# Patient Record
Sex: Male | Born: 1975 | Race: Black or African American | Hispanic: No | Marital: Married
Health system: Southern US, Community
[De-identification: ages and names within clinical notes are randomized; demographics above are authoritative.]

---

## 2013-08-21 ENCOUNTER — Inpatient Hospital Stay: Payer: Self-pay | Admitting: Surgery

## 2013-08-21 ENCOUNTER — Ambulatory Visit: Payer: Self-pay | Admitting: Family Medicine

## 2013-08-21 LAB — COMPREHENSIVE METABOLIC PANEL
Albumin: 3.7 g/dL (ref 3.4–5.0)
Calcium, Total: 9.2 mg/dL (ref 8.5–10.1)
Chloride: 97 mmol/L — ABNORMAL LOW (ref 98–107)
Co2: 33 mmol/L — ABNORMAL HIGH (ref 21–32)
EGFR (African American): >60
Potassium: 3.7 mmol/L (ref 3.5–5.1)
SGOT(AST): 21 U/L (ref 15–37)

## 2013-08-21 LAB — URINALYSIS, COMPLETE
Bacteria: NONE SEEN
Bilirubin,UR: NEGATIVE
Glucose,UR: NEGATIVE mg/dL (ref 0–75)
Nitrite: NEGATIVE
Ph: 5 (ref 4.5–8.0)
Protein: NEGATIVE
Squamous Epithelial: NONE SEEN

## 2013-08-21 LAB — CBC
HGB: 15.6 g/dL (ref 13.0–18.0)
MCH: 28.9 pg (ref 26.0–34.0)
MCHC: 33.4 g/dL (ref 32.0–36.0)
MCV: 87 fL (ref 80–100)
RBC: 5.39 10*6/uL (ref 4.40–5.90)
RDW: 12.9 % (ref 11.5–14.5)
WBC: 10 10*3/uL (ref 3.8–10.6)

## 2013-08-23 LAB — CBC WITH DIFFERENTIAL/PLATELET
Basophil #: 0 10*3/uL (ref 0.0–0.1)
Eosinophil %: 0.3 %
HCT: 35.4 % — ABNORMAL LOW (ref 40.0–52.0)
HGB: 12.2 g/dL — ABNORMAL LOW (ref 13.0–18.0)
Lymphocyte #: 0.4 10*3/uL — ABNORMAL LOW (ref 1.0–3.6)
Lymphocyte %: 5.1 %
MCH: 29.3 pg (ref 26.0–34.0)
MCHC: 34.3 g/dL (ref 32.0–36.0)
MCV: 85 fL (ref 80–100)
Monocyte #: 1 x10 3/mm (ref 0.2–1.0)
Monocyte %: 11.5 %
Neutrophil #: 7.3 10*3/uL — ABNORMAL HIGH (ref 1.4–6.5)
Neutrophil %: 82.9 %
Platelet: 113 10*3/uL — ABNORMAL LOW (ref 150–440)
RBC: 4.15 10*6/uL — ABNORMAL LOW (ref 4.40–5.90)
RDW: 13.4 % (ref 11.5–14.5)

## 2013-08-23 LAB — BASIC METABOLIC PANEL
Anion Gap: 5 — ABNORMAL LOW (ref 7–16)
Glucose: 113 mg/dL — ABNORMAL HIGH (ref 65–99)
Osmolality: 269 (ref 275–301)
Potassium: 3.6 mmol/L (ref 3.5–5.1)

## 2013-08-23 LAB — PATHOLOGY REPORT

## 2013-08-24 LAB — CBC WITH DIFFERENTIAL/PLATELET
Basophil %: 0.4 %
HCT: 34.6 % — ABNORMAL LOW (ref 40.0–52.0)
HGB: 11.8 g/dL — ABNORMAL LOW (ref 13.0–18.0)
Lymphocyte %: 4.9 %
Monocyte #: 1.3 x10 3/mm — ABNORMAL HIGH (ref 0.2–1.0)
Neutrophil #: 8.9 10*3/uL — ABNORMAL HIGH (ref 1.4–6.5)

## 2015-02-14 NOTE — Op Note (Signed)
PATIENT NAME:  Tony Conway, Tony Conway MR#:  [id number] DATE OF BIRTH:  01/05/1976  DATE OF PROCEDURE:  08/21/2013  PREOPERATIVE DIAGNOSIS: Acute appendicitis.   POSTOPERATIVE DIAGNOSIS: Acute ruptured appendicitis.   SURGERY: Laparoscopic appendectomy.  ANESTHESIA: General.  SURGEON: Quentin Orealph L. Ely, MD   OPERATIVE PROCEDURE: With the patient in the supine position, after induction of appropriate general anesthesia, the patient's abdomen was prepped with ChloraPrep and draped with sterile towels. The patient was placed in the head down, feet up position. A small infraumbilical incision was made in the standard fashion and carried down bluntly through the subcutaneous tissue. A Veress needle was used to cannulate the peritoneal cavity. CO2 was insufflated to appropriate pressure measurements. When approximately 10.5 liters of CO2 were instilled, the Veress needle was withdrawn.   An 11 mm [employer] port was inserted into the peritoneal cavity. It peritoneal position was confirmed. CO2 was reinsufflated. A midepigastric transverse incision was made and an 11 mm port was inserted under direct vision. The right lower quadrant was investigated. The omentum was adherent to the anterior abdominal wall. In peeling the omentum back, a large amount of purulence and feculent-looking material was encountered. There appeared to be a small abscess and the base of the appendix appeared to be completely necrotic and ruptured.   A transverse suprapubic incision was made and a 12 mm port inserted under direct vision. The camera was moved to the upper port. The area inspected. No significant abnormalities were noted on penetrating the abdomen originally. Attention was turned to the right lower quadrant. The appendix was huge, extending well behind the cecum. The cecum was mobilized by taking down the lateral attachments. The appendix was moved into view.  Appendix appeared to be completely obliterated about 2 to 3 cm  above base of a large fecalith in that area.   The mesoappendix was taken out two applications of the Endo GIA stapling device carrying a white load, and then the necrotic base was amputated using a single application of the Endo GIA stapler carrying a blue load. The appendix was captured in the Endo Catch apparatus and removed through the suprapubic incision. The port was replaced. Dissection was then carried out along the base of the cecum. The tenia were identified, ending in what appeared to be a thickened but viable appendix. The base of the appendix was then amputated by removing a small portion of the cecum to ensure adequate closure. That piece of appendix was captured with the fecalith in the second Endo Catch apparatus and removed through the suprapubic incision.   The area was copiously irrigated. I was a little concerned about the viability of all of the staple lines with the exception of the cecal staple line, so I oversewed the remaining mesoappendix with 0 Surgidac, closing the adjacent fat to completely obliterate that area. The Endo Stitch apparatus was utilized. A single stitch was placed. The omentum was then placed back over this area.   The area was copiously suctioned and irrigated with 5 liters of warm saline solution. The abdomen was desufflated. The umbilical and suprapubic incisions were closed along the fascia with a single suture of 0 Vicryl. The skin was closed with 5-0 nylon.  The area was infiltrated with 0.25% Marcaine for postoperative pain control. Sterile dressings were applied. The patient was returned to the recovery room, having tolerated the procedure well. Sponge, instrument and needle counts were correct x2 in the Operating Room.   ____________________________ Carmie Endalph L. Ely III, MD rle:cg  D: 08/21/2013 23:23:00 ET T: 08/22/2013 00:04:39 ET JOB#: [id number]  cc: Letitia Caul, MD Quentin Ore III, MD, <Dictator>   Quentin Ore MD ELECTRONICALLY SIGNED  08/22/2013 21:31

## 2015-02-14 NOTE — H&P (Signed)
PATIENT NAME:  Tony Conway, Javonn MR#:  [id number] DATE OF BIRTH:  [birth year]  DATE OF ADMISSION:  08/21/2013  CHIEF COMPLAINT: Right lower quadrant pain.   HISTORY OF PRESENT ILLNESS: This is a patient with 3 days of abdominal pain that started on Saturday. He had nausea and a single emesis on Saturday. Denies melena or hematochezia. No diarrhea. He has in fact been constipated. He has had a poor appetite. Denies fevers or chills. Has never had an episode like this before. States that originally the pain was somewhat diffuse and periumbilical and is now in the right lower quadrant. Denies back pain. Denies dysuria.   PAST MEDICAL HISTORY: None.   PAST SURGICAL HISTORY: None.   ALLERGIES: None.  MEDICATIONS: None.   FAMILY HISTORY: Noncontributory.   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 He smokes tobacco but is struggling with stopping and is currently abstinent for several days. He does not drink alcohol ever.   REVIEW OF SYSTEMS: Ten system review has been performed and negative with the exception of that mentioned in the HPI.  PHYSICAL EXAMINATION: GENERAL: Healthy black male patient.  VITAL SIGNS: Temperature 99, pulse 70, respirations 18, blood pressure 118/70, 98% room air sat. Pain scale 4.  HEENT: No scleral icterus.  NECK: No palpable neck nodes.  CHEST: Clear to auscultation.  CARDIAC: Regular rate and rhythm.  ABDOMEN: Soft. There is tenderness, maximal at McBurney's point, in the right lower quadrant with a questionable Rovsing sign. No mass. Some percussion tenderness. Some rebound tenderness.  EXTREMITIES: Without edema.  NEUROLOGIC: Grossly intact.  INTEGUMENT: No jaundice.   LABORATORY AND DIAGNOSTICS: CT scan will be personally reviewed. It is suggestive of acute appendicitis.   Electrolytes are within normal limits. CO2 elevated at 33, chloride at 97, sodium 133. Lipase of 216. White blood cell count is 10, H and H of 15.6 and  46.7 and a platelet count of 130.   ASSESSMENT AND PLAN: This is a patient with right lower quadrant pain and tenderness with signs of peritoneal irritation, positive CT scan suggestive of appendicitis. I have recommended diagnostic laparoscopy, laparoscopic appendectomy. The rationale for this has been discussed. The options of observation have been reviewed. The risks of bleeding, infection, recurrence of symptoms, failure to resolve his symptoms, open procedure and negative laparoscopy. This was all reviewed for him. He understood and agreed to proceed. ____________________________ Adah Salvageichard E. Excell Seltzerooper, MD rec:sb D: 08/21/2013 15:19:25 ET T: 08/21/2013 15:40:56 ET JOB#: [id number]  cc: Adah Salvageichard E. Excell Seltzerooper, MD, <Dictator> Lattie HawICHARD E Jermond Burkemper MD ELECTRONICALLY SIGNED 08/22/2013 9:36

## 2015-02-14 NOTE — Op Note (Signed)
PATIENT NAME:  Tony Conway, Tony Conway MR#:  [id number] DATE OF BIRTH:  08/03/1976  DATE OF ADMISSION: 08/21/2013  DATE OF DISCHARGE: 08/25/2013   BRIEF HISTORY: The patient is a 39-year-old gentleman, seen in the Emergency Room with signs and symptoms consistent with acute appendicitis. CT scan suggested a ruptured appendicitis. He was taken to surgery on the evening of 08/21/2013, where he underwent a laparoscopic appendectomy. The procedure was uncomplicated. No significant intraoperative problems. He did have significant rupture with large amount of free peritoneal pus noted. The appendix was removed. The area was irrigated with multiple liters of warm saline solution.  He was maintained on antibiotics post surgery. He regained his bowel function after several days. He was switched to p.o. antibiotics. He was discharged home on the 1st, to be followed in the office in 7 to 10 days' time. Bathing, activity, and driving instructions were given to the patient.   DISCHARGE MEDICATIONS: Include Keflex 500 mg p.o. q.8 hours and clindamycin 300 mg p.o. q.8 hours, Vicodin 5/325 every 4 to 6 hours p.r.n. and meloxicam 15 mg p.o. once a day as needed.   FINAL DISCHARGE DIAGNOSIS: Acute ruptured appendicitis.   SURGERY: Laparoscopic appendectomy.   ____________________________ Jaimie Pippins L. Ely III, MD rle:cg D: 08/31/2013 22:47:19 ET T: 08/31/2013 23:08:49 ET JOB#: [id number]  cc: Katrese Shell L. Ely III, MD, <Dictator> Heidi M. Grandis, MD  Samyuktha Brau L ELY MD ELECTRONICALLY SIGNED 09/01/2013 21:15 

## 2015-02-14 NOTE — H&P (Signed)
Subjective/Chief Complaint rlq pain   History of Present Illness 3 days abd pain now localized to RLQ. nausea, single emesis. no fevers or chills no prior episode   Past History PMH none PSH none   Past Med/Surgical Hx:  None, patient reports no medical history.:   None, patient reports no surgical history.:   ALLERGIES:  Shellfish: Itching  Family and Social History:  Family History Non-Contributory   Social History positive  tobacco, negative ETOH, CREE   + Tobacco Current (within 1 year)   Place of Living Home   Review of Systems:  Fever/Chills No   Cough No   Sputum No   Abdominal Pain Yes   Diarrhea No   Constipation Yes   Nausea/Vomiting Yes   SOB/DOE No   Chest Pain No   Dysuria No   Tolerating Diet No  Nauseated   Medications/Allergies Reviewed Medications/Allergies reviewed   Physical Exam:  GEN no acute distress   HEENT pink conjunctivae   NECK supple   RESP normal resp effort  clear BS  no use of accessory muscles   CARD regular rate   ABD positive tenderness  soft  max tenderness at McB point, +- Rosving's sign   LYMPH negative neck   EXTR negative edema   SKIN normal to palpation   PSYCH alert, A+O to time, place, person, good insight   Lab Results: Hepatic:  28-Oct-14 13:38   Bilirubin, Total 1.0  Alkaline Phosphatase 71  SGPT (ALT) 17  SGOT (AST) 21  Total Protein, Serum 8.0  Albumin, Serum 3.7  Routine Chem:  28-Oct-14 13:38   Lipase 216 (Result(s) reported on 21 Aug 2013 at 02:03PM.)  Glucose, Serum 84  BUN 15  Creatinine (comp) 1.16  Sodium, Serum  133  Potassium, Serum 3.7  Chloride, Serum  97  CO2, Serum  33  Calcium (Total), Serum 9.2  Osmolality (calc) 266  eGFR (African American) >60  eGFR (Non-African American) >60 (eGFR values <61m/min/1.73 m2 may be an indication of chronic kidney disease (CKD). Calculated eGFR is useful in patients with stable renal function. The eGFR calculation will not  be reliable in acutely ill patients when serum creatinine is changing rapidly. It is not useful in  patients on dialysis. The eGFR calculation may not be applicable to patients at the low and high extremes of body sizes, pregnant women, and vegetarians.)  Anion Gap  3  Routine Hem:  28-Oct-14 13:38   WBC (CBC) 10.0  RBC (CBC) 5.39  Hemoglobin (CBC) 15.6  Hematocrit (CBC) 46.7  Platelet Count (CBC)  130 (Result(s) reported on 21 Aug 2013 at 02:07PM.)  MCV 87  MCH 28.9  MCHC 33.4  RDW 12.9   Radiology Results: CT:    28-Oct-14 12:49, CT Abdomen and Pelvis With Contrast  CT Abdomen and Pelvis With Contrast  REASON FOR EXAM:    CALL REPORT [id number] PAIN EVAL APPENDICITIS  COMMENTS:       PROCEDURE: CT  - CT ABDOMEN / PELVIS  W  - Aug 21 2013 12:49PM     RESULT: Emergent contrast enhanced CT of the abdomen and pelvis is   performed with 100 mL of Isovue-370 iodinated intravenous contrast and   oral contrast with images reconstructed at 3.0 mm slice thickness in the   axial plane. There is no previous CT for comparison.    There is an abnormal appearance in the pericecal and periappendiceal   region with significant inflammatory stranding. There is thickening of  the wall of the cecum and the base of the appendix with dilation of what   appears to be the appendix on image 114 along the right lateral pelvic   region of up to 1.35 cm which is concerning for acute appendicitis. There     is no abscess formation or definite perforation evident. There is some   minimal thickening of the terminal ileum. The possibility of Crohn's   disease or enterocolitis is not completely excluded. Acute appendicitis   remains the primary consideration. There is a cyst in the liver in the   right lobe on image 44 measuring 9 mm diameter. The liver as a second   similar cyst in the right lobe near the caudate lobe on image 38. The   liver and spleen otherwise appear unremarkable. There is no  pleural or   pericardial effusion. The stomach, remaining portion of the colon and   small bowel appear to be unremarkable. The aorta is normal in caliber   without exsanguination. The adrenal glands and kidneys appear   unremarkable. The lung bases are clear area the bony structures show   partial lumbarization of the first sacral segment with an otherwise   normal appearance.    IMPRESSION:   1. Findings consistent with acute appendicitis. The possibility of  enterocolitis is not excluded but the changes in the terminal ileum and   cecum may be reactive. Correlate with clinical and laboratory data. No   definite perforation or abscess formation evident. Other nonemergent   findings as discussed above.    Dictation Site: 1(*)        Verified By: Sundra Aland, M.D., MD    Assessment/Admission Diagnosis acute appendicitis rec lap appy risks and options rev'd ahgrees with plan   Electronic Signatures: Florene Glen (MD)  (Signed 28-Oct-14 15:16)  Authored: CHIEF COMPLAINT and HISTORY, PAST MEDICAL/SURGIAL HISTORY, ALLERGIES, FAMILY AND SOCIAL HISTORY, REVIEW OF SYSTEMS, PHYSICAL EXAM, LABS, Radiology, ASSESSMENT AND PLAN   Last Updated: 28-Oct-14 15:16 by Florene Glen (MD)

## 2015-02-14 NOTE — Discharge Summary (Signed)
PATIENT NAME:  Tony Conway, Tony Conway MR#:  [id number] DATE OF BIRTH:  July 26, 1976  DATE OF ADMISSION: 08/21/2013  DATE OF DISCHARGE: 08/25/2013   BRIEF HISTORY: The patient is a 39 year old gentleman, seen in the Emergency Room with signs and symptoms consistent with acute appendicitis. CT scan suggested a ruptured appendicitis. He was taken to surgery on the evening of 08/21/2013, where he underwent a laparoscopic appendectomy. The procedure was uncomplicated. No significant intraoperative problems. He did have significant rupture with large amount of free peritoneal pus noted. The appendix was removed. The area was irrigated with multiple liters of warm saline solution.  He was maintained on antibiotics post surgery. He regained his bowel function after several days. He was switched to p.o. antibiotics. He was discharged home on the 1st, to be followed in the office in 7 to 10 days' time. Bathing, activity, and driving instructions were given to the patient.   DISCHARGE MEDICATIONS: Include Keflex 500 mg p.o. q.8 hours and clindamycin 300 mg p.o. q.8 hours, Vicodin 5/325 every 4 to 6 hours p.r.n. and meloxicam 15 mg p.o. once a day as needed.   FINAL DISCHARGE DIAGNOSIS: Acute ruptured appendicitis.   SURGERY: Laparoscopic appendectomy.   ____________________________ Quentin Orealph L. Ely III, MD rle:cg D: 08/31/2013 22:47:19 ET T: 08/31/2013 23:08:49 ET JOB#: [id number]  cc: Carmie Endalph L. Ely III, MD, <Dictator> Letitia CaulHeidi M. Grandis, MD  Quentin OreALPH L ELY MD ELECTRONICALLY SIGNED 09/01/2013 21:15

## 2015-03-09 IMAGING — CT CT ABD-PELV W/ CM
1 of 2 series · 15 of 32 positions shown, 19 images · non-contrast
Comparison: none

REASON FOR EXAM: CALL REPORT [id number] RLQ PAIN EVAL APPENDICITIS
COMMENTS:

[Series 2: 3mm soft tissue · axial · 0.76mm/px · z∈[-984,-507]mm · 15 of 175 slices shown, 19 images]
[im 8/175  soft-tissue]
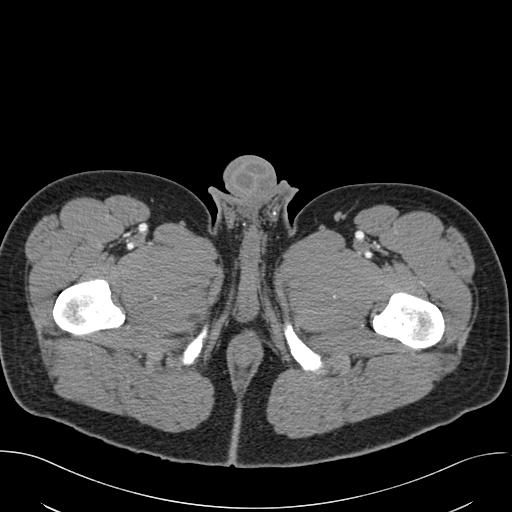
[im 8/175  bone]
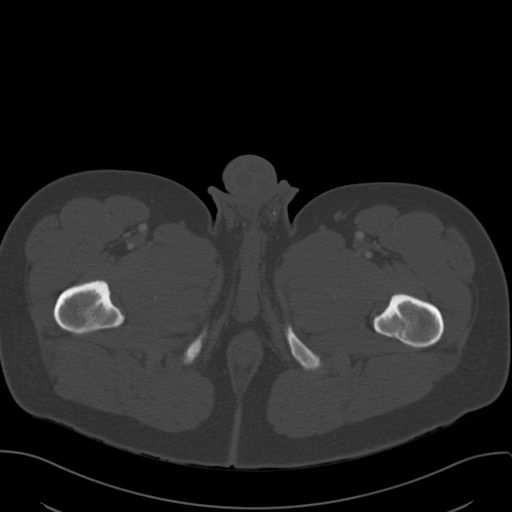
[im 22/175  soft-tissue]
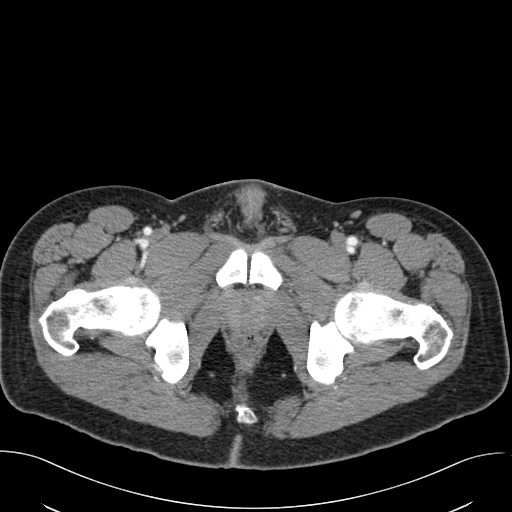
[im 37/175  soft-tissue]
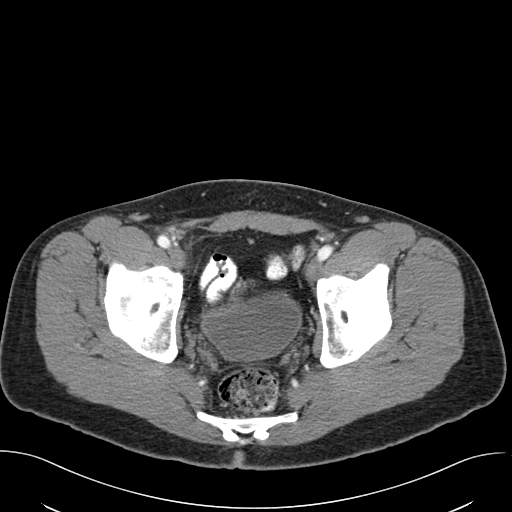
[im 51/175  soft-tissue]
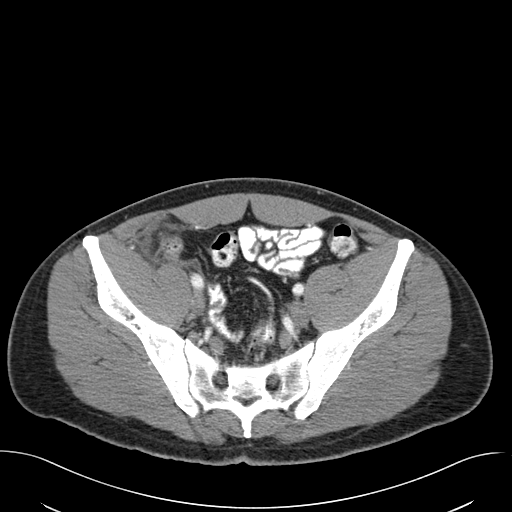
[im 59/175  soft-tissue]
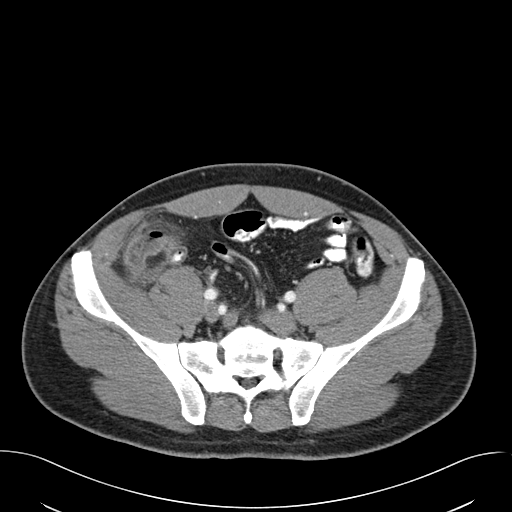
[im 73/175  soft-tissue]
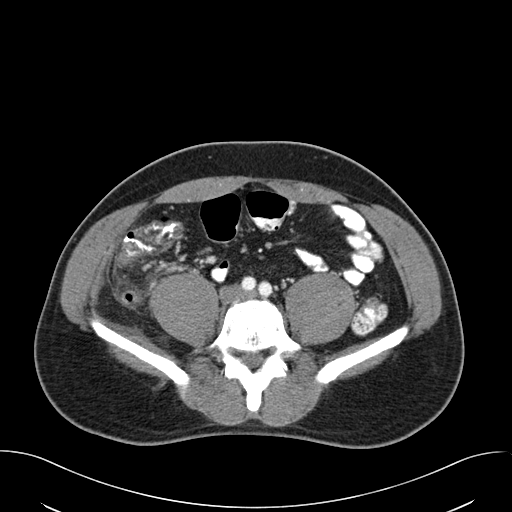
[im 88/175  soft-tissue]
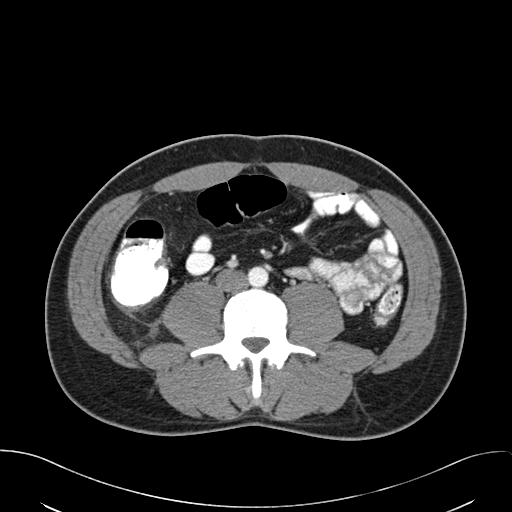
[im 102/175  soft-tissue]
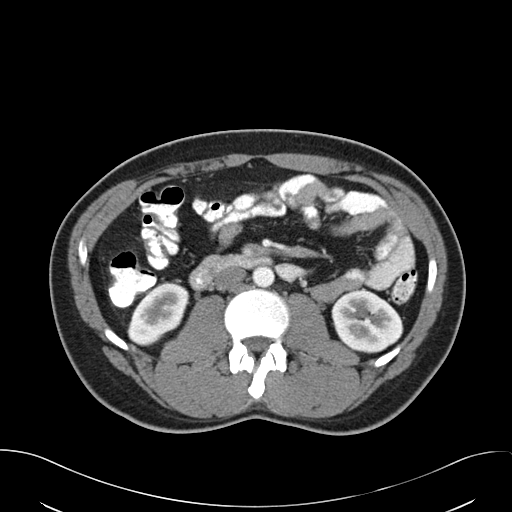
[im 117/175  soft-tissue]
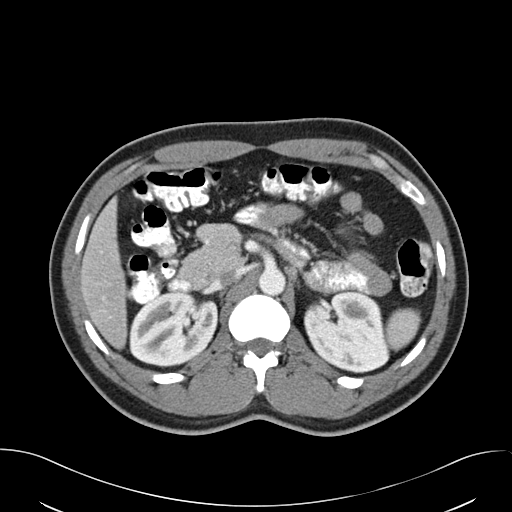
[im 117/175  bone]
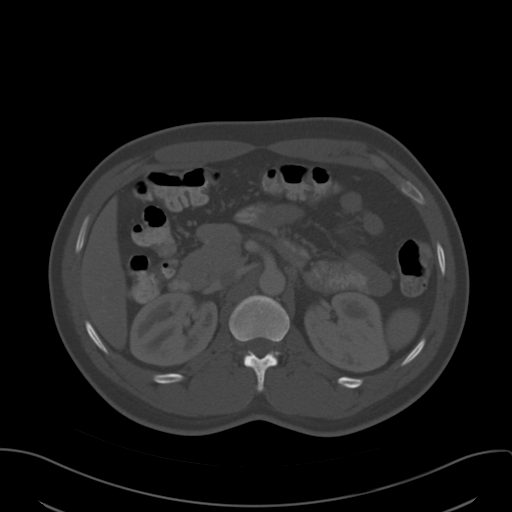
[im 124/175  soft-tissue]
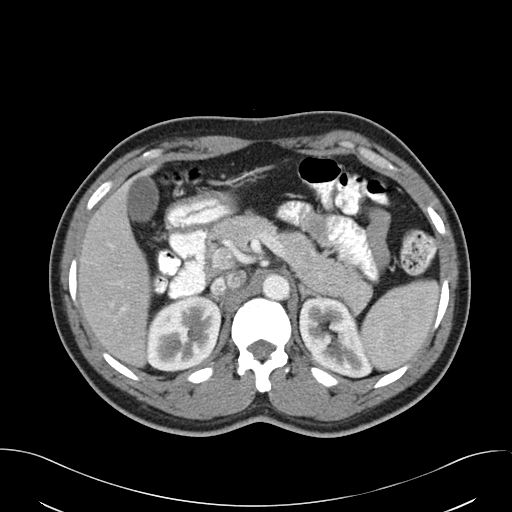
[im 138/175  soft-tissue]
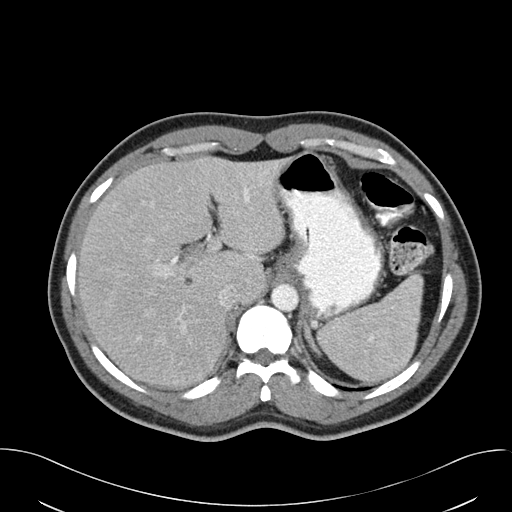
[im 146/175  lung]
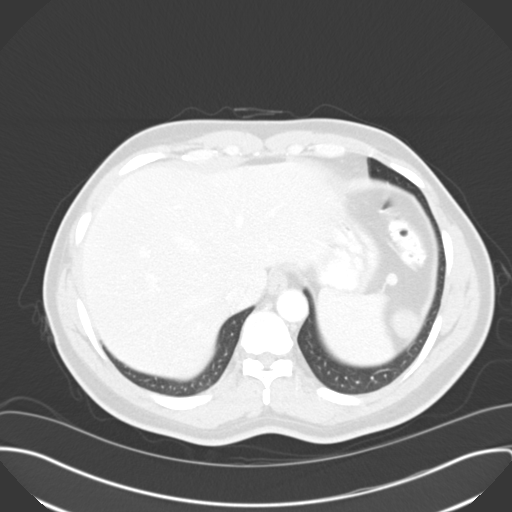
[im 153/175  soft-tissue]
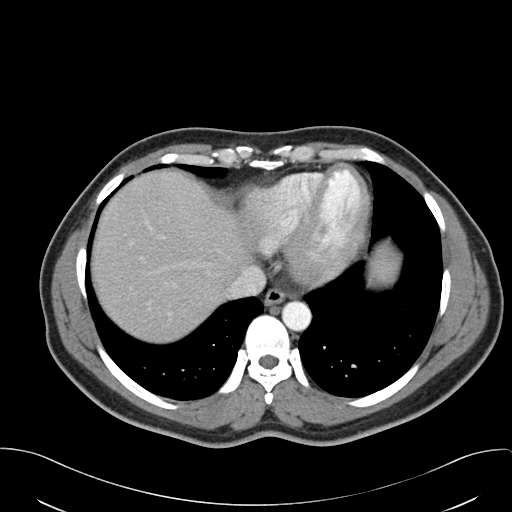
[im 153/175  lung]
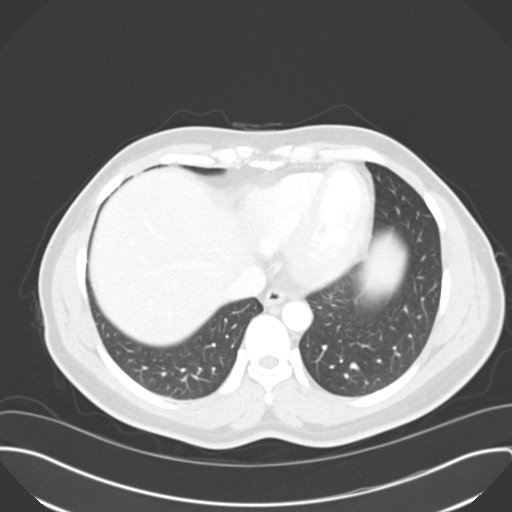
[im 160/175  lung]
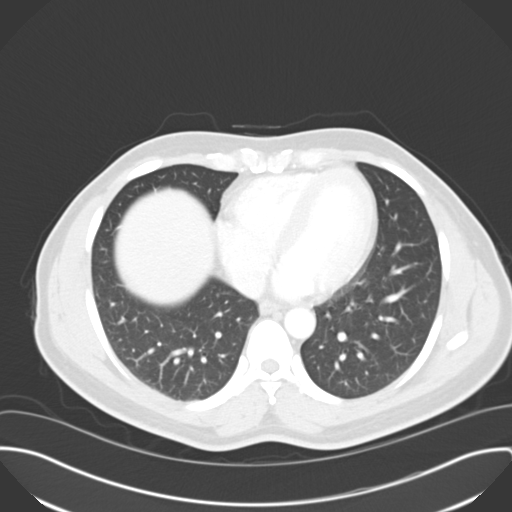
[im 167/175  soft-tissue]
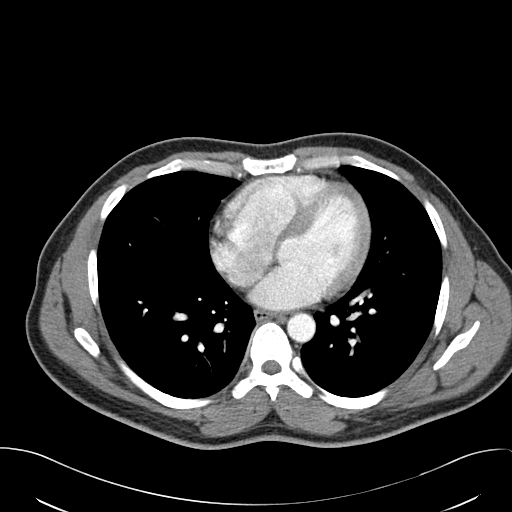
[im 167/175  lung]
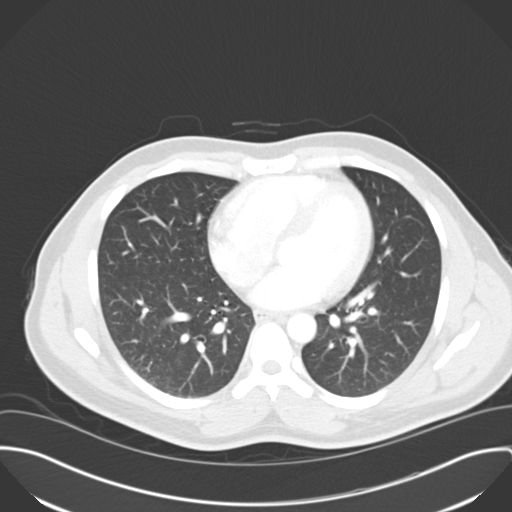

[15 of 32 positions shown; findings below may reference images not displayed]

PROCEDURE:     CT  - CT ABDOMEN / PELVIS  W  - August 21, 2013 [DATE]

RESULT:     Emergent contrast enhanced CT of the abdomen and pelvis is
performed with 100 mL of Asovue-5WE iodinated intravenous contrast and oral
contrast with images reconstructed at 3.0 mm slice thickness in the axial
plane. There is no previous CT for comparison.

There is an abnormal appearance in the pericecal and periappendiceal region
with significant inflammatory stranding. There is thickening of the wall of
the cecum and the base of the appendix with dilation of what appears to be
the appendix on image 114 along the right lateral pelvic region of up to
1.35 cm which is concerning for acute appendicitis. There is no abscess
formation or definite perforation evident. There is some minimal thickening
of the terminal ileum. The possibility of Crohn's disease or enterocolitis
is not completely excluded. Acute appendicitis remains the primary
consideration. There is a cyst in the liver in the right lobe on image 44
measuring 9 mm diameter. The liver as a second similar cyst in the right
lobe near the caudate lobe on image 38. The liver and spleen otherwise
appear unremarkable. There is no pleural or pericardial effusion. The
stomach, remaining portion of the colon and small bowel appear to be
unremarkable. The aorta is normal in caliber without exsanguination. The
adrenal glands and kidneys appear unremarkable. The lung bases are clear
area the bony structures show partial lumbarization of the first sacral
segment with an otherwise normal appearance.
IMPRESSION: 1. Findings consistent with acute appendicitis. The possibility of
enterocolitis is not excluded but the changes in the terminal ileum and
cecum may be reactive. Correlate with clinical and laboratory data. No
definite perforation or abscess formation evident. Other nonemergent
findings as discussed above.

[REDACTED](*)
